# Patient Record
Sex: Female | Born: 1965 | Race: White | Hispanic: No
Health system: Southern US, Community
[De-identification: ages and names within clinical notes are randomized; demographics above are authoritative.]

---

## 2004-12-17 ENCOUNTER — Emergency Department (HOSPITAL_COMMUNITY): Admission: EM | Admit: 2004-12-17 | Discharge: 2004-12-17 | Payer: Self-pay | Admitting: Emergency Medicine

## 2008-10-10 ENCOUNTER — Encounter: Admission: RE | Admit: 2008-10-10 | Discharge: 2008-10-10 | Payer: Self-pay | Admitting: Family Medicine

## 2008-10-26 ENCOUNTER — Other Ambulatory Visit: Admission: RE | Admit: 2008-10-26 | Discharge: 2008-10-26 | Payer: Self-pay | Admitting: Family Medicine

## 2012-10-16 ENCOUNTER — Other Ambulatory Visit (HOSPITAL_COMMUNITY)
Admission: RE | Admit: 2012-10-16 | Discharge: 2012-10-16 | Disposition: A | Discharge status: Home / Self Care | Payer: BC Managed Care – PPO | Source: Ambulatory Visit | Attending: Family Medicine | Admitting: Family Medicine

## 2012-10-16 ENCOUNTER — Other Ambulatory Visit: Payer: Self-pay | Admitting: Family Medicine

## 2012-10-16 DIAGNOSIS — Z1151 Encounter for screening for human papillomavirus (HPV): Secondary | ICD-10-CM | POA: Insufficient documentation

## 2012-10-16 DIAGNOSIS — Z124 Encounter for screening for malignant neoplasm of cervix: Secondary | ICD-10-CM | POA: Insufficient documentation

## 2013-01-26 ENCOUNTER — Other Ambulatory Visit: Payer: Self-pay | Admitting: Family Medicine

## 2013-01-26 DIAGNOSIS — R350 Frequency of micturition: Secondary | ICD-10-CM

## 2013-02-10 ENCOUNTER — Ambulatory Visit
Admission: RE | Admit: 2013-02-10 | Discharge: 2013-02-10 | Disposition: A | Discharge status: Home / Self Care | Payer: BC Managed Care – PPO | Source: Ambulatory Visit | Attending: Family Medicine | Admitting: Family Medicine

## 2013-02-10 DIAGNOSIS — R350 Frequency of micturition: Secondary | ICD-10-CM

## 2014-03-24 ENCOUNTER — Other Ambulatory Visit: Payer: Self-pay

## 2014-03-24 DIAGNOSIS — Z1231 Encounter for screening mammogram for malignant neoplasm of breast: Secondary | ICD-10-CM

## 2014-03-24 IMAGING — US US PELVIS COMPLETE
1 series · 14 of 25 positions shown · non-contrast
Comparison: None.

CLINICAL DATA: Irregular menses.

TRANSABDOMINAL AND TRANSVAGINAL ULTRASOUND OF PELVIS
TECHNIQUE: Both transabdominal and transvaginal ultrasound
examinations of the pelvis were performed. Transabdominal technique
was performed for global imaging of the pelvis including uterus,
ovaries, adnexal regions, and pelvic cul-de-sac.
It was necessary to proceed with endovaginal exam following the
transabdominal exam to visualize the uterus, ovaries and adnexal
regions.

[Series 1: us pelvis complete · 0.28mm/px · 14 of 60 slices shown]
[im 1/60]
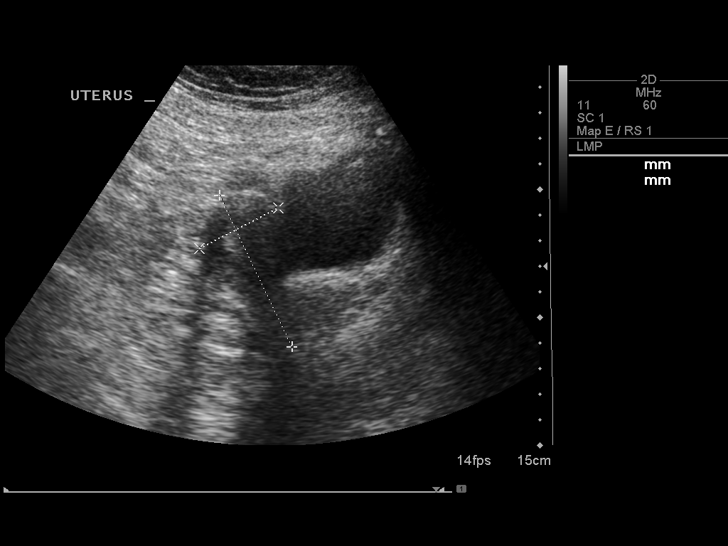
[im 5/60]
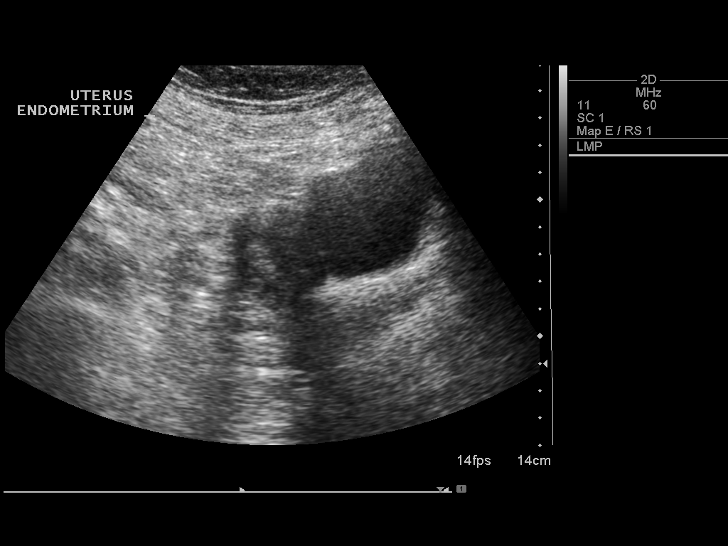
[im 10/60]
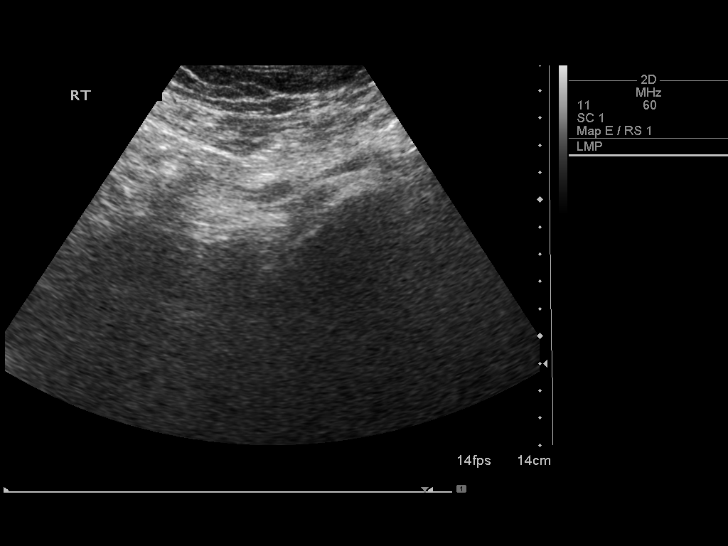
[im 15/60]
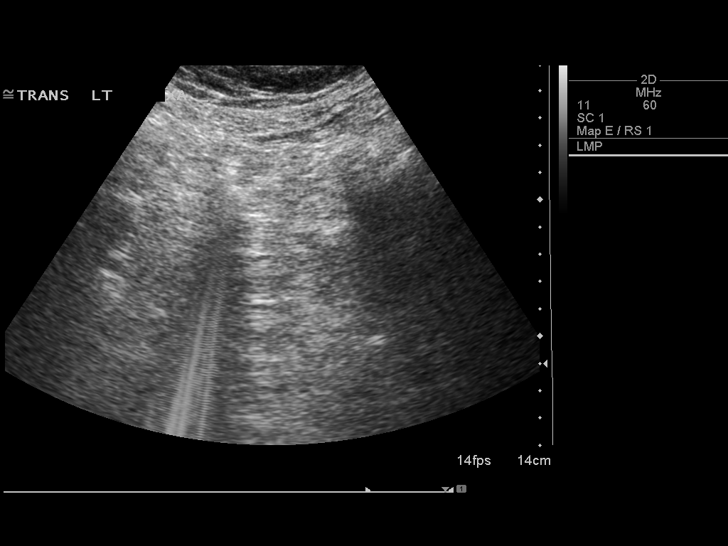
[im 20/60]
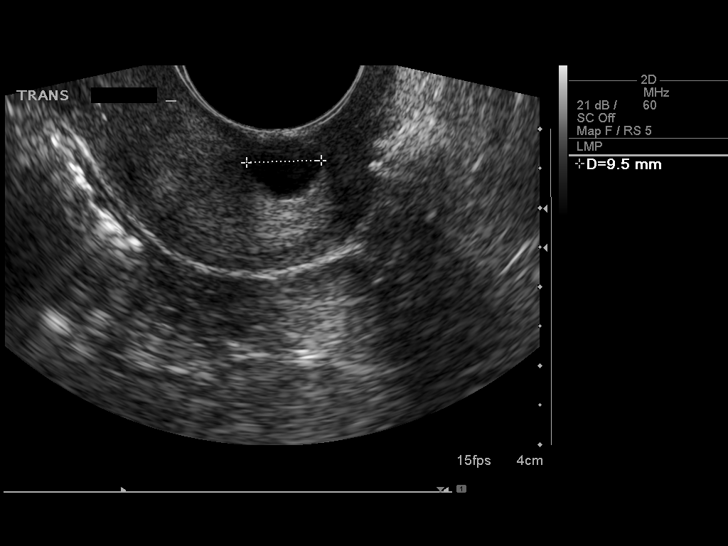
[im 23/60]
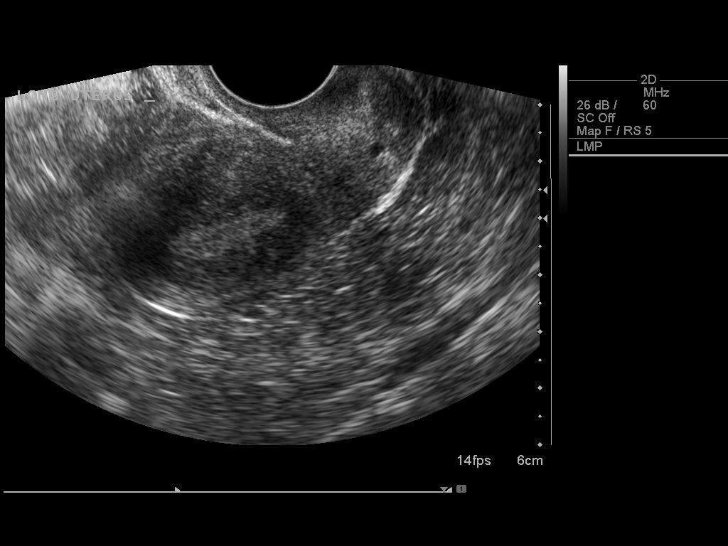
[im 28/60]
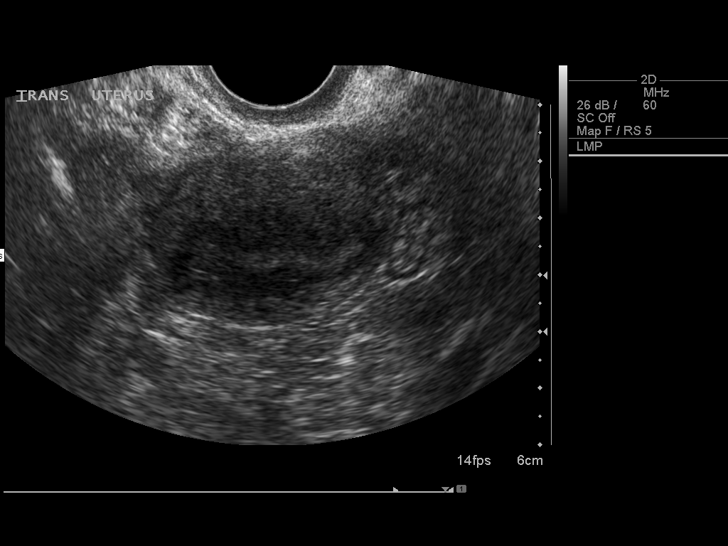
[im 32/60]
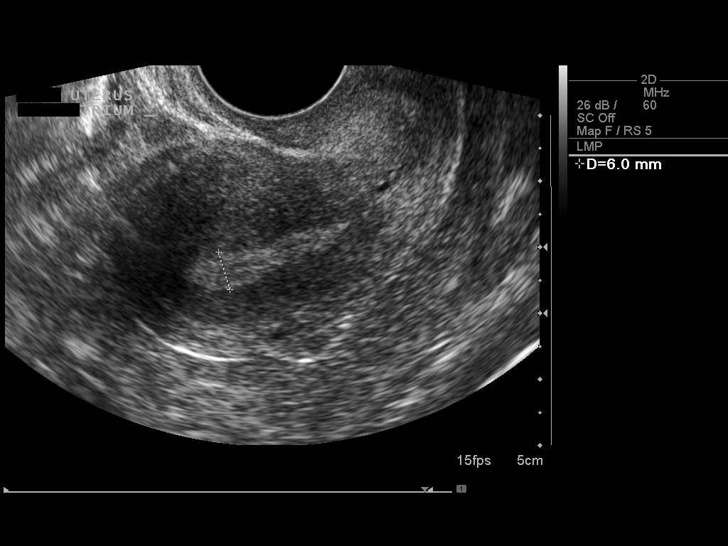
[im 37/60]
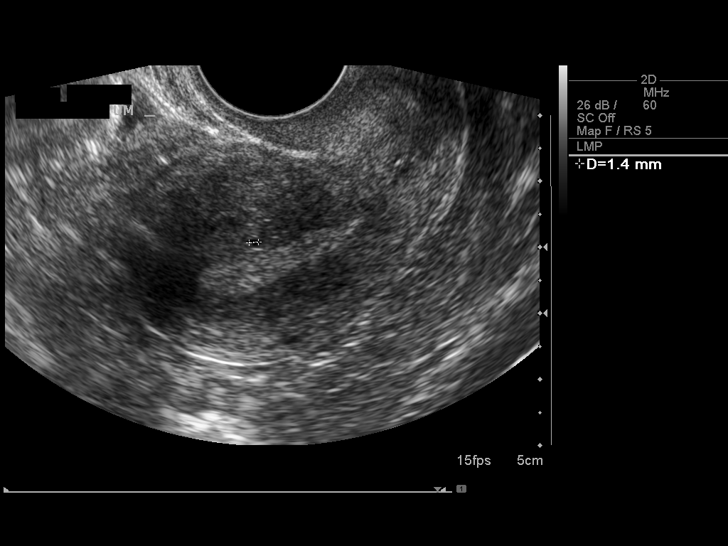
[im 40/60]
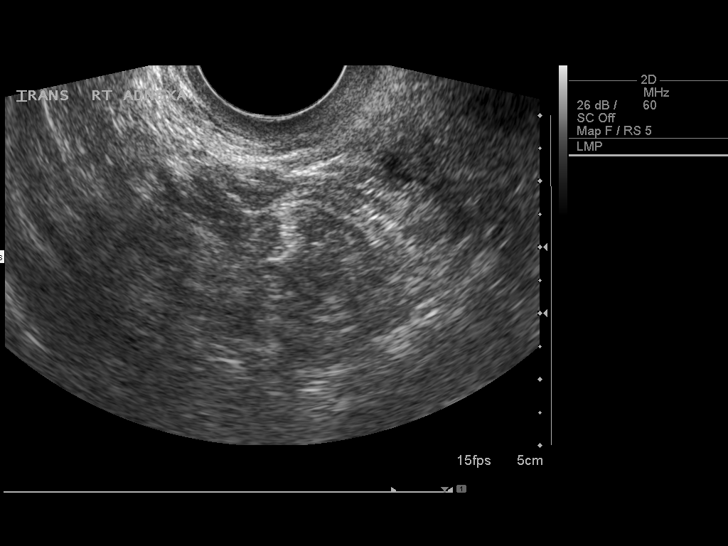
[im 45/60]
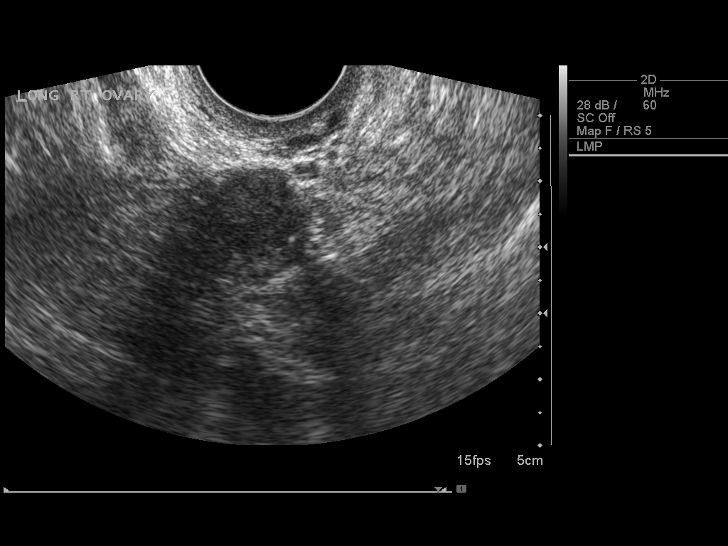
[im 50/60]
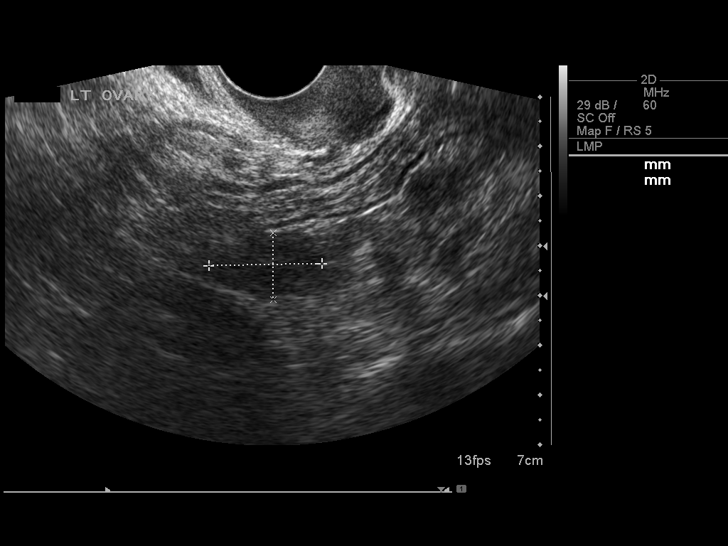
[im 55/60]
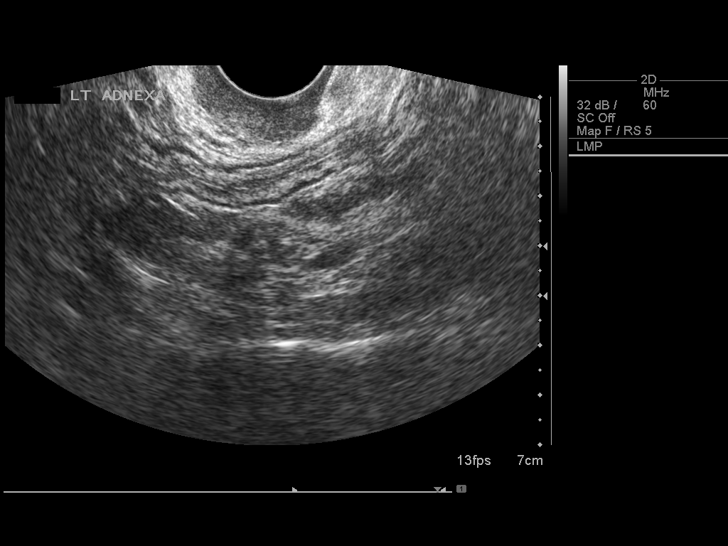
[im 60/60]
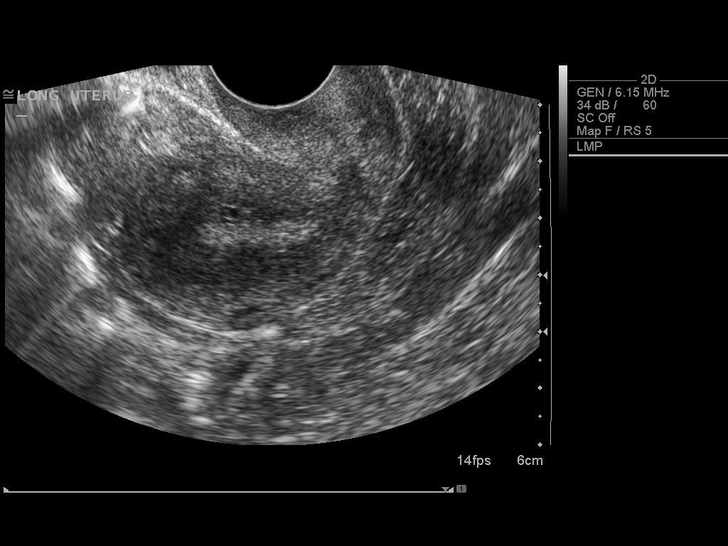

[14 of 25 positions shown; findings below may reference images not displayed]

FINDINGS: Uterus: Measures 6.6 x 3.5 x 4.3 cm, negative.

Endometrium: Measures 6 mm, likely within normal limits for a
reportedly   perimenopausal female.

Right ovary:  Measures 2.0 x 1.8 x 1.4 cm, negative.

Left ovary: Measures 2.3 x 1.3 x 1.9 cm, negative.

Other findings: No free fluid.
IMPRESSION: Normal study. No evidence of pelvic mass or other significant
abnormality.

## 2014-04-18 ENCOUNTER — Ambulatory Visit
Admission: RE | Admit: 2014-04-18 | Discharge: 2014-04-18 | Disposition: A | Discharge status: Home / Self Care | Payer: BC Managed Care – PPO | Source: Ambulatory Visit

## 2014-04-18 ENCOUNTER — Encounter (INDEPENDENT_AMBULATORY_CARE_PROVIDER_SITE_OTHER): Payer: Self-pay

## 2014-04-18 DIAGNOSIS — Z1231 Encounter for screening mammogram for malignant neoplasm of breast: Secondary | ICD-10-CM

## 2014-04-20 ENCOUNTER — Other Ambulatory Visit: Payer: Self-pay | Admitting: Family Medicine

## 2014-04-20 DIAGNOSIS — R928 Other abnormal and inconclusive findings on diagnostic imaging of breast: Secondary | ICD-10-CM

## 2014-04-29 ENCOUNTER — Ambulatory Visit
Admission: RE | Admit: 2014-04-29 | Discharge: 2014-04-29 | Disposition: A | Discharge status: Home / Self Care | Payer: BC Managed Care – PPO | Source: Ambulatory Visit | Attending: Family Medicine | Admitting: Family Medicine

## 2014-04-29 DIAGNOSIS — R928 Other abnormal and inconclusive findings on diagnostic imaging of breast: Secondary | ICD-10-CM

## 2015-05-12 ENCOUNTER — Other Ambulatory Visit: Payer: Self-pay

## 2015-05-12 DIAGNOSIS — Z1231 Encounter for screening mammogram for malignant neoplasm of breast: Secondary | ICD-10-CM

## 2015-06-16 ENCOUNTER — Ambulatory Visit
Admission: RE | Admit: 2015-06-16 | Discharge: 2015-06-16 | Disposition: A | Discharge status: Home / Self Care | Payer: BC Managed Care – PPO | Source: Ambulatory Visit

## 2015-06-16 DIAGNOSIS — Z1231 Encounter for screening mammogram for malignant neoplasm of breast: Secondary | ICD-10-CM

## 2018-05-12 ENCOUNTER — Other Ambulatory Visit: Payer: Self-pay | Admitting: Family Medicine

## 2018-05-12 DIAGNOSIS — R634 Abnormal weight loss: Secondary | ICD-10-CM

## 2018-05-22 ENCOUNTER — Ambulatory Visit
Admission: RE | Admit: 2018-05-22 | Discharge: 2018-05-22 | Disposition: A | Discharge status: Home / Self Care | Payer: BC Managed Care – PPO | Source: Ambulatory Visit | Attending: Family Medicine | Admitting: Family Medicine

## 2018-05-22 DIAGNOSIS — R634 Abnormal weight loss: Secondary | ICD-10-CM

## 2018-05-22 MED ORDER — IOPAMIDOL (ISOVUE-300) INJECTION 61%
100.0000 mL | Freq: Once | INTRAVENOUS | Status: AC | PRN
  Administered 2018-05-22: 100 mL via INTRAVENOUS

## 2020-01-17 ENCOUNTER — Ambulatory Visit: Payer: BC Managed Care – PPO | Attending: Internal Medicine

## 2020-01-17 DIAGNOSIS — Z23 Encounter for immunization: Secondary | ICD-10-CM

## 2020-01-17 NOTE — Progress Notes (Signed)
   Covid-19 Vaccination Clinic  Name:  BASIL BLAKESLEY    MRN: [id number] DOB: 1966-05-21  01/17/2020  Ms. Nedd was observed post Covid-19 immunization for 15 minutes without incident. She was provided with Vaccine Information Sheet and instruction to access the V-Safe system.   Ms. Acheampong was instructed to call 911 with any severe reactions post vaccine: Marland Kitchen Difficulty breathing  . Swelling of face and throat  . A fast heartbeat  . A bad rash all over body  . Dizziness and weakness   Immunizations Administered    Name Date Dose VIS Date Route   Pfizer COVID-19 Vaccine 01/17/2020  9:56 AM 0.3 mL 10/01/2019 Intramuscular   Manufacturer: [employer], [employer]   Lot: [id number]   NDC: [address]-6

## 2020-02-08 ENCOUNTER — Ambulatory Visit: Payer: BC Managed Care – PPO | Attending: Internal Medicine

## 2020-02-08 DIAGNOSIS — Z23 Encounter for immunization: Secondary | ICD-10-CM

## 2020-02-08 NOTE — Progress Notes (Signed)
   Covid-19 Vaccination Clinic  Name:  Heather Fritz    MRN: [id number] DOB: 26-Dec-1965  02/08/2020  [name] was observed post Covid-19 immunization for 15 minutes without incident. She was provided with Vaccine Information Sheet and instruction to access the V-Safe system.   [name] was instructed to call 911 with any severe reactions post vaccine: Marland Kitchen Difficulty breathing  . Swelling of face and throat  . A fast heartbeat  . A bad rash all over body  . Dizziness and weakness   Immunizations Administered    Name Date Dose VIS Date Route   Pfizer COVID-19 Vaccine 02/08/2020 12:06 PM 0.3 mL 12/15/2018 Intramuscular   Manufacturer: [employer], [employer]   Lot: [id number]   NDC: [address]-6

## 2020-09-11 ENCOUNTER — Other Ambulatory Visit: Payer: Self-pay | Admitting: Family Medicine

## 2020-09-11 DIAGNOSIS — Z1231 Encounter for screening mammogram for malignant neoplasm of breast: Secondary | ICD-10-CM

## 2020-09-13 ENCOUNTER — Other Ambulatory Visit: Payer: Self-pay | Admitting: Family Medicine

## 2020-09-13 DIAGNOSIS — N644 Mastodynia: Secondary | ICD-10-CM

## 2020-10-06 ENCOUNTER — Other Ambulatory Visit: Payer: Self-pay | Admitting: Family Medicine

## 2020-11-01 ENCOUNTER — Ambulatory Visit
Admission: RE | Admit: 2020-11-01 | Discharge: 2020-11-01 | Disposition: A | Discharge status: Home / Self Care | Payer: BC Managed Care – PPO | Source: Ambulatory Visit | Attending: Family Medicine | Admitting: Family Medicine

## 2020-11-01 ENCOUNTER — Other Ambulatory Visit: Payer: Self-pay

## 2020-11-01 ENCOUNTER — Ambulatory Visit: Payer: BC Managed Care – PPO

## 2020-11-01 DIAGNOSIS — N644 Mastodynia: Secondary | ICD-10-CM

## 2021-12-13 IMAGING — MG DIGITAL DIAGNOSTIC BILAT W/ TOMO W/ CAD
8 series · 9 of 24 positions shown · non-contrast
Comparison: Previous exam(s).

ACR Breast Density Category a: The breast tissue is almost entirely
fatty.

CLINICAL DATA: Diffuse LEFT breast pain.

EXAM:
DIGITAL DIAGNOSTIC BILATERAL MAMMOGRAM WITH TOMO AND CAD

[L MLO synth-2D]
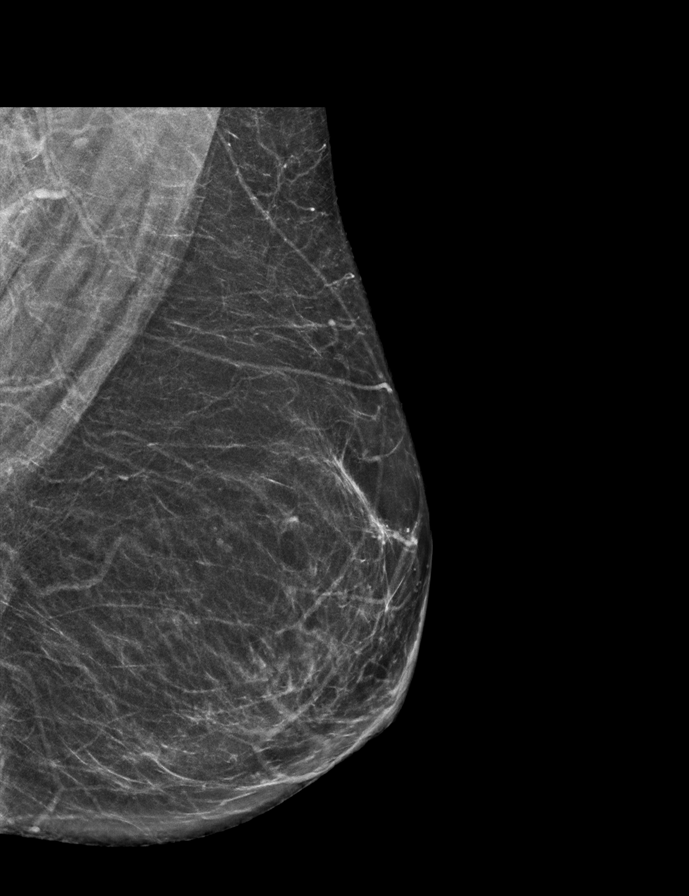

[L CC synth-2D]
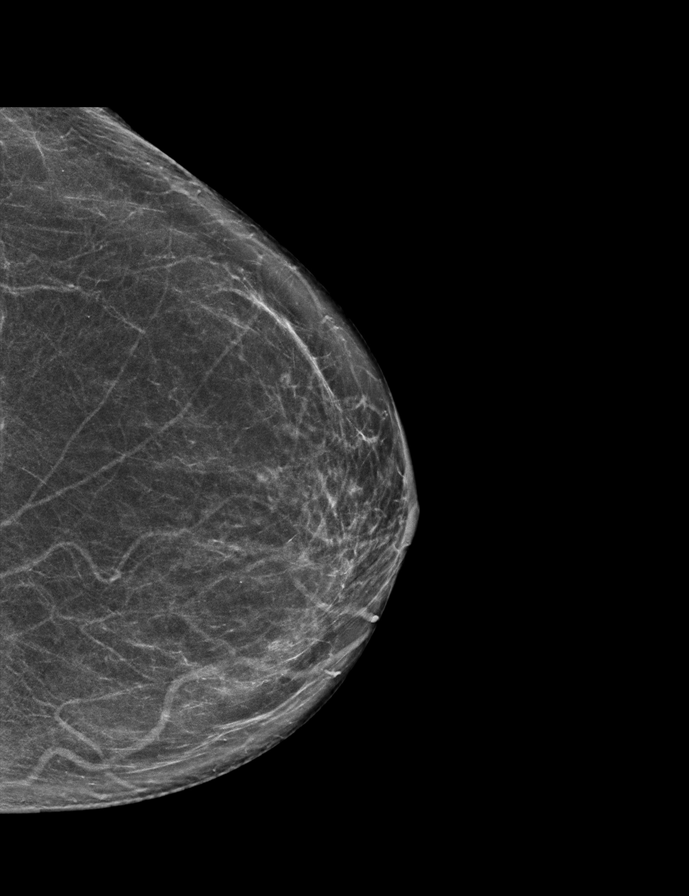

[R MLO synth-2D]
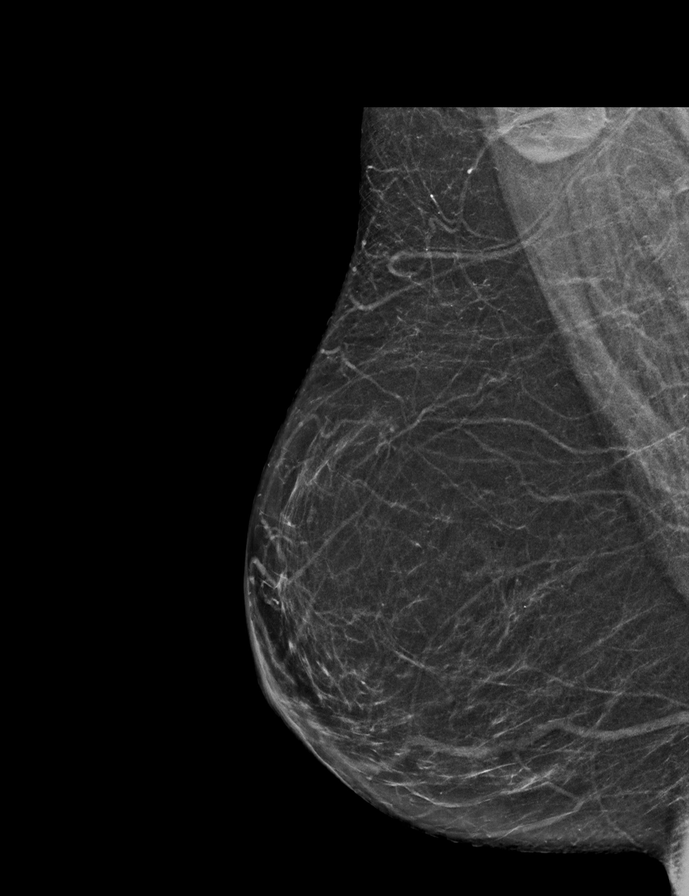

[R CC synth-2D]
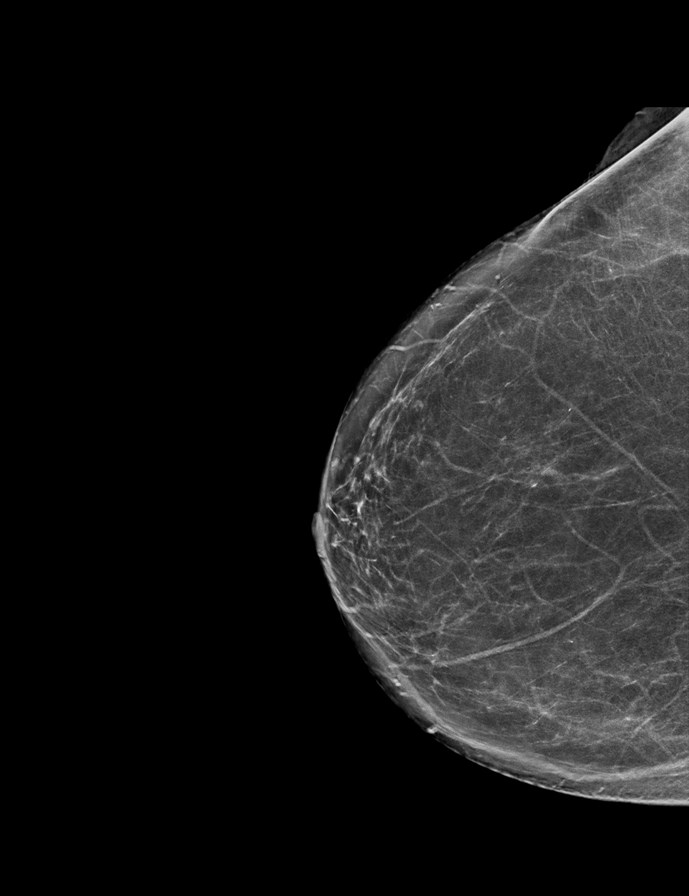

[L CC tomo · 2 of 64 frames shown]
[frame 21/64]
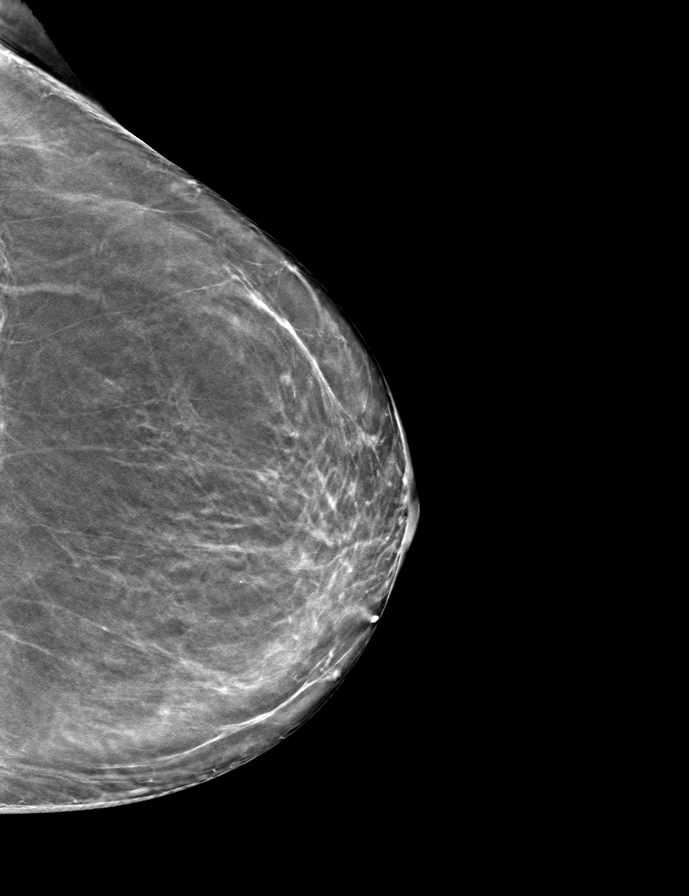
[frame 33/64]
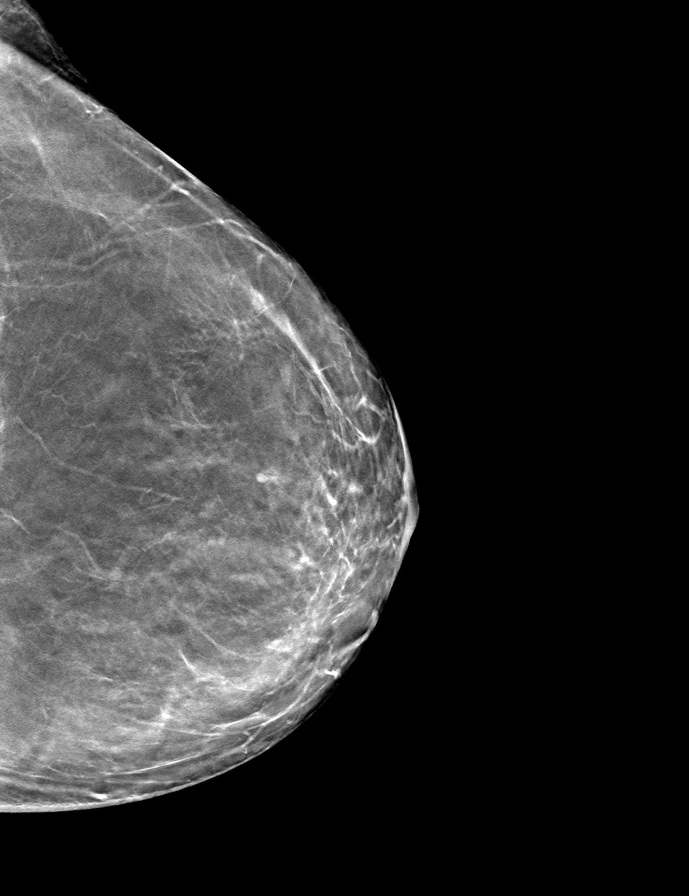

[L MLO tomo · tomo slice 33/64.0]
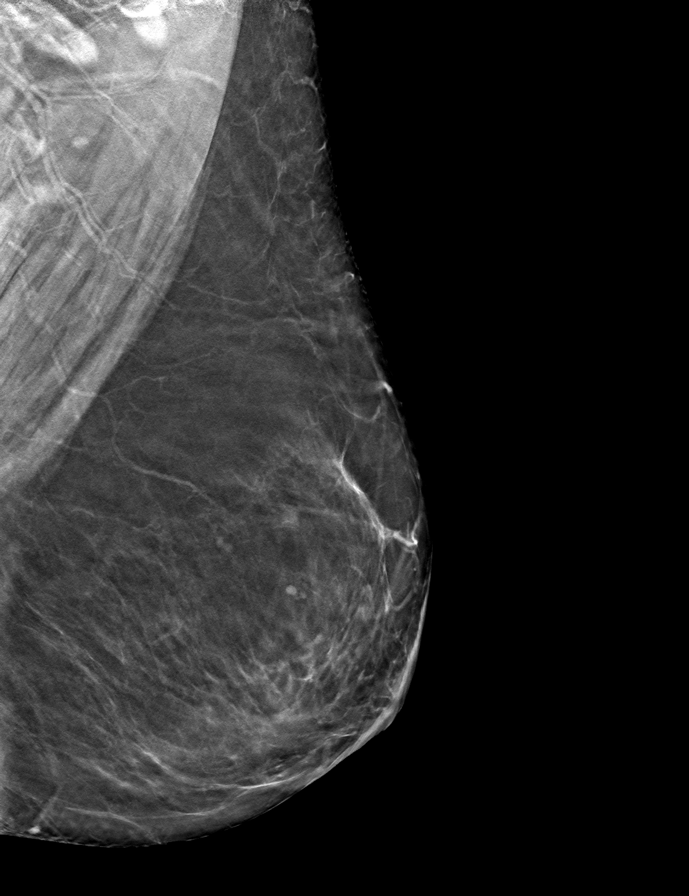

[R CC tomo · tomo slice 35/68.0]
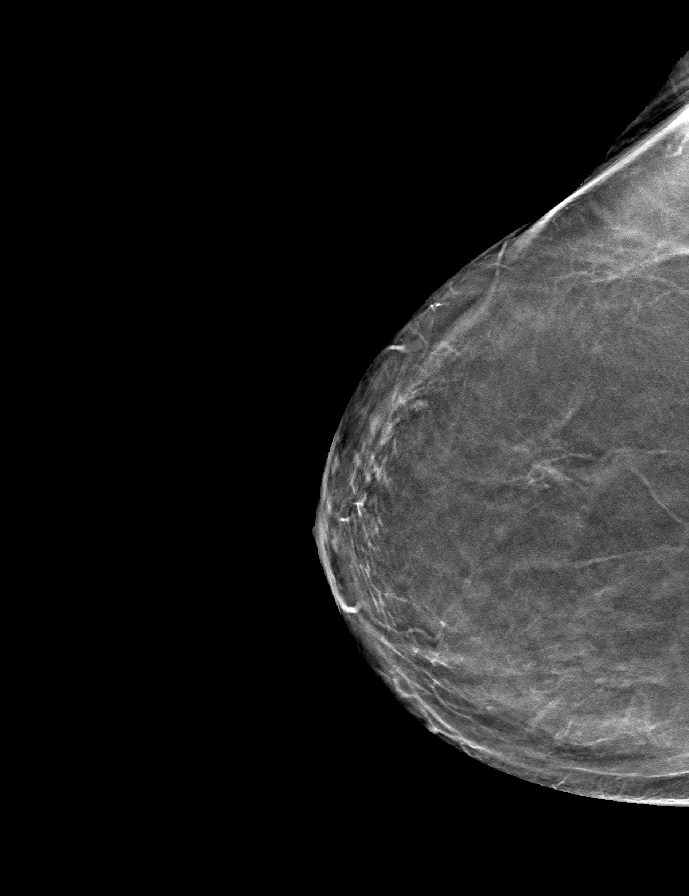

[R MLO tomo · tomo slice 32/63.0]
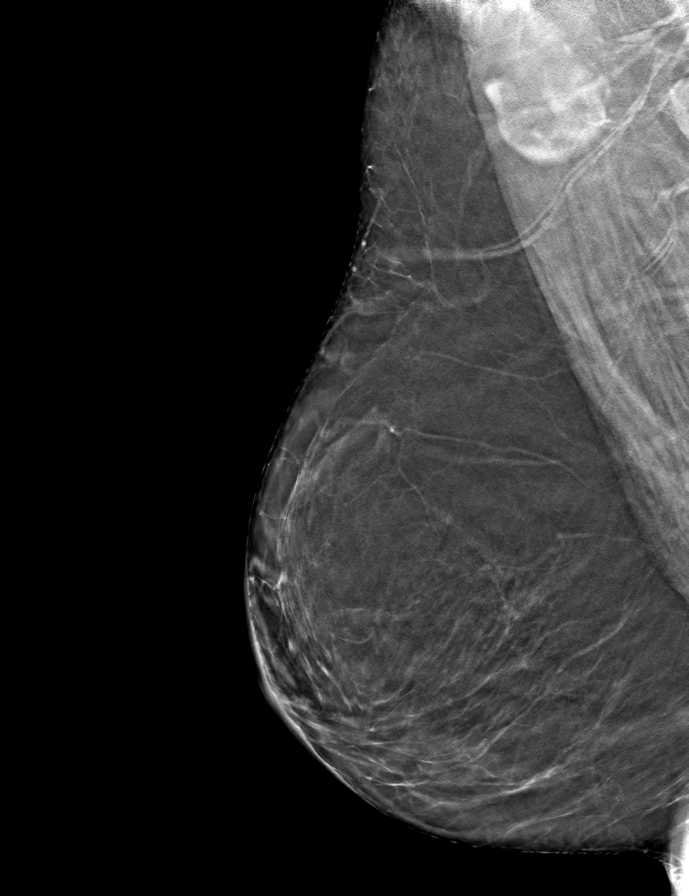

[9 of 24 positions shown; findings below may reference images not displayed]

FINDINGS: No suspicious mass, distortion, or microcalcifications are
identified to suggest presence of malignancy.

Mammographic images were processed with CAD.
IMPRESSION: No mammographic evidence for malignancy.

Breast pain is a common condition. It can be exacerbated by
caffeine, hormonal changes, hormonal medications, and weight
changes. It can be improved by wearing adequate support,
over-the-counter pain medication, low-fat diet, exercise, and ice as
needed. Studies have shown an improvement in cyclic pain with use of
evening primrose oil and vitamin E. However, vitamin E supplements
are not recommended for patients with hypertension. Often breast
pain will resolve on its own without intervention.

RECOMMENDATION:
Screening mammogram in one year.(Code:[id number])

I have discussed the findings and recommendations with the patient.
If applicable, a reminder letter will be sent to the patient
regarding the next appointment.

BI-RADS CATEGORY  1: Negative.

## 2022-06-28 ENCOUNTER — Ambulatory Visit: Payer: BC Managed Care – PPO | Attending: Family Medicine | Admitting: Physical Therapy

## 2022-06-28 ENCOUNTER — Encounter: Payer: Self-pay | Admitting: Physical Therapy

## 2022-06-28 DIAGNOSIS — M6281 Muscle weakness (generalized): Secondary | ICD-10-CM | POA: Diagnosis present

## 2022-06-28 DIAGNOSIS — G8929 Other chronic pain: Secondary | ICD-10-CM | POA: Diagnosis present

## 2022-06-28 DIAGNOSIS — R262 Difficulty in walking, not elsewhere classified: Secondary | ICD-10-CM | POA: Insufficient documentation

## 2022-06-28 DIAGNOSIS — R278 Other lack of coordination: Secondary | ICD-10-CM | POA: Insufficient documentation

## 2022-06-28 DIAGNOSIS — M545 Low back pain, unspecified: Secondary | ICD-10-CM | POA: Diagnosis present

## 2022-06-28 DIAGNOSIS — R293 Abnormal posture: Secondary | ICD-10-CM | POA: Insufficient documentation

## 2022-06-28 DIAGNOSIS — R2681 Unsteadiness on feet: Secondary | ICD-10-CM | POA: Diagnosis present

## 2022-06-28 NOTE — Therapy (Signed)
OUTPATIENT PHYSICAL THERAPY THORACOLUMBAR EVALUATION   Patient Name: Heather Fritz MRN: [id number] DOB:March 29, 1966, 56 y.o., female Today's Date: 06/28/2022   PT End of Session - 06/28/22 1150     Visit Number 1    Date for PT Re-Evaluation 08/23/22    PT Start Time 0928    PT Stop Time 1010    PT Time Calculation (min) 42 min    Activity Tolerance Patient tolerated treatment well    Behavior During Therapy [facility] for tasks assessed/performed             History reviewed. No pertinent past medical history. History reviewed. No pertinent surgical history. There are no problems to display for this patient.   PCP: Sigmund Hazel, MD   REFERRING PROVIDER: Sigmund Hazel, MD   REFERRING DIAG: Diagnosis M54.50 (ICD-10-CM) - Low back pain, unspecified   Rationale for Evaluation and Treatment Rehabilitation  THERAPY DIAG:  Abnormal posture  Difficulty in walking, not elsewhere classified  Muscle weakness (generalized)  Other lack of coordination  Unsteadiness on feet  Chronic bilateral low back pain without sciatica  ONSET DATE: 05/27/2022   SUBJECTIVE:                                                                                                                                                                                           SUBJECTIVE STATEMENT: Patient is a walker. Over the past 2 years she has had increasing back pain. It has gotten to where it limits her mobility. She takes Tylenol twice a day, within recommended dosages. At times it extends into her L lateral hip.  PERTINENT HISTORY:  N/A  PAIN:  Are you having pain? Yes: NPRS scale: 8/10 Pain location: low back/SI joints Pain description: deep, achy Aggravating factors: walking, gardening Relieving factors: Tylenol, Salon Pass less effective   PRECAUTIONS: None  WEIGHT BEARING RESTRICTIONS No  FALLS:  Has patient fallen in last 6 months? No  LIVING ENVIRONMENT: Lives with: lives with an adult  companion Lives in: House/apartment Stairs: No Has following equipment at home: None  OCCUPATION: Retired Runner, broadcasting/film/video  PLOF: Independent  PATIENT GOALS Patient would like to learn some exercises to relieve her back pain.   OBJECTIVE:   DIAGNOSTIC FINDINGS:  N/A  PATIENT SURVEYS:  FOTO 42.7  SCREENING FOR RED FLAGS: Bowel or bladder incontinence: No Spinal tumors: No Cauda equina syndrome: No Compression fracture: No   COGNITION:  Overall cognitive status: Within functional limits for tasks assessed     SENSATION: WFL  MUSCLE LENGTH: Hamstrings: Right 80 deg; Left 78 deg  POSTURE: forward head, decreased lumbar lordosis, and decreased thoracic kyphosis  PALPATION: TTP on lumbar paraspinals, B QL B piriformis, B gluts  LUMBAR ROM:   Active  A/PROM  eval  Flexion floor  Extension WNL  Right lateral flexion To knee, mild pain  Left lateral flexion To knee mild pain  Right rotation WNL  Left rotation WNL   (Blank rows = not tested)  LOWER EXTREMITY ROM:   otherwise WFL  Active  Right eval Left eval  Hip flexion    Hip extension    Hip abduction    Hip adduction    Hip internal rotation    Hip external rotation Mod impaired Mod impaired  Knee flexion    Knee extension    Ankle dorsiflexion    Ankle plantarflexion    Ankle inversion    Ankle eversion      LOWER EXTREMITY MMT:    MMT Right eval Left eval  Hip flexion 4 4  Hip extension 4- 4  Hip abduction 4- 4  Hip adduction    Hip internal rotation    Hip external rotation    Knee flexion 4 4  Knee extension 4 4  Ankle dorsiflexion 4 4  Ankle plantarflexion    Ankle inversion    Ankle eversion     (Blank rows = not tested)  LUMBAR SPECIAL TESTS:  Straight leg raise test: Negative, Slump test: Negative, and SI Compression/distraction test: Negative, SI compression and distraction (-) and Leg length equal.  FUNCTIONAL TESTS:  5 times sit to stand: 11.4 Berg Balance Scale:  53  GAIT: Distance walked: 80 Assistive device utilized: None Level of assistance: Complete Independence Comments: Very quick movement    TODAY'S TREATMENT  HEP, educ   PATIENT EDUCATION:  Education details: HEP, POC Person educated: Patient Education method: Programmer, multimedia, Facilities manager, and Handouts Education comprehension: verbalized understanding and returned demonstration   HOME EXERCISE PROGRAM: [id number]  ASSESSMENT:  CLINICAL IMPRESSION: Patient is a 56 y.o. who was seen today for physical therapy evaluation and treatment for Chronic LBP. Screening for SI and neural pain were neg. She demonstrates mild weakness in her trunk and hips, tightness in ER B, mild balance deficits in SLS. She will benefit from education and HEP for strength and stretching to improve her hip and trunk stability, decrease pain, allow for her to ambulate as she used to.   OBJECTIVE IMPAIRMENTS decreased activity tolerance, decreased balance, decreased coordination, decreased mobility, difficulty walking, decreased ROM, decreased strength, impaired flexibility, postural dysfunction, and pain.   ACTIVITY LIMITATIONS carrying, lifting, standing, and locomotion level  PARTICIPATION LIMITATIONS: cleaning, laundry, shopping, occupation, and yard work  PERSONAL FACTORS Past/current experiences are also affecting patient's functional outcome.   REHAB POTENTIAL: Good  CLINICAL DECISION MAKING: Stable/uncomplicated  EVALUATION COMPLEXITY: Low   GOALS: Goals reviewed with patient? Yes  SHORT TERM GOALS: Target date: 07/19/2022  I with initial HEP Baseline: Goal status: INITIAL  LONG TERM GOALS: Target date: 08/23/2022  I with final HEP Baseline:  Goal status: INITIAL  2.  Increase FOTO score to at least 60. Baseline: 43 Goal status: INITIAL  3.  Patient will be able to walk at least 1/2 mile with back pain < 3/5 Baseline: Walks down her driveway and back with pain 8/10 Goal status:  INITIAL  4.  Increase BERG SLS score to 3/4 with either leg to demonstrate improved strength and stability. Baseline: 2/4, R < L Goal status: INITIAL  PLAN: PT FREQUENCY: 1x/week  PT DURATION: 8 weeks  PLANNED INTERVENTIONS: Therapeutic exercises, Therapeutic activity, Neuromuscular re-education,  Balance training, Gait training, Patient/Family education, Self Care, Joint mobilization, Dry Needling, Electrical stimulation, Moist heat, Ionotophoresis 4mg /ml Dexamethasone, and Manual therapy.  PLAN FOR NEXT SESSION: Update Hep with more strength and stretching.   , DPT 06/28/2022, 11:52 AM

## 2022-07-08 ENCOUNTER — Ambulatory Visit: Payer: BC Managed Care – PPO | Admitting: Physical Therapy

## 2022-07-08 ENCOUNTER — Encounter: Payer: Self-pay | Admitting: Physical Therapy

## 2022-07-08 DIAGNOSIS — M6281 Muscle weakness (generalized): Secondary | ICD-10-CM

## 2022-07-08 DIAGNOSIS — R293 Abnormal posture: Secondary | ICD-10-CM

## 2022-07-08 DIAGNOSIS — R262 Difficulty in walking, not elsewhere classified: Secondary | ICD-10-CM

## 2022-07-08 NOTE — Therapy (Signed)
OUTPATIENT PHYSICAL THERAPY THORACOLUMBAR TREATMENT   Patient Name: Heather Fritz MRN: [id number] DOB:[birth year], 56 y.o., female Today's Date: 07/08/2022   PT End of Session - 07/08/22 0844     Visit Number 2    Date for PT Re-Evaluation 08/23/22    PT Start Time 0845    PT Stop Time 0930    PT Time Calculation (min) 45 min    Activity Tolerance Patient tolerated treatment well    Behavior During Therapy [facility] for tasks assessed/performed             History reviewed. No pertinent past medical history. History reviewed. No pertinent surgical history. There are no problems to display for this patient.   PCP: Kathyrn Lass, MD   REFERRING PROVIDER: Kathyrn Lass, MD   REFERRING DIAG: Diagnosis M54.50 (ICD-10-CM) - Low back pain, unspecified   Rationale for Evaluation and Treatment Rehabilitation  THERAPY DIAG:  Abnormal posture  Difficulty in walking, not elsewhere classified  Muscle weakness (generalized)  ONSET DATE: 05/27/2022   SUBJECTIVE:                                                                                                                                                                                           SUBJECTIVE STATEMENT: "Im great" "Took a Asprin this morning "  PERTINENT HISTORY:  N/A  PAIN:  Are you having pain? Yes: NPRS scale: 0/10 Pain location: low back/SI joints Pain description: deep, achy Aggravating factors: walking, gardening Relieving factors: Tylenol, Salon Pass less effective   PRECAUTIONS: None  WEIGHT BEARING RESTRICTIONS No  FALLS:  Has patient fallen in last 6 months? No  LIVING ENVIRONMENT: Lives with: lives with an adult companion Lives in: House/apartment Stairs: No Has following equipment at home: None  OCCUPATION: Retired Pharmacist, hospital  PLOF: Independent  PATIENT GOALS Patient would like to learn some exercises to relieve her back pain.   OBJECTIVE:   PATIENT SURVEYS:  FOTO 42.7  SCREENING FOR  RED FLAGS: Bowel or bladder incontinence: No Spinal tumors: No Cauda equina syndrome: No Compression fracture: No   COGNITION:  Overall cognitive status: Within functional limits for tasks assessed     SENSATION: WFL  MUSCLE LENGTH: Hamstrings: Right 80 deg; Left 78 deg  POSTURE: forward head, decreased lumbar lordosis, and decreased thoracic kyphosis  PALPATION: TTP on lumbar paraspinals, B QL B piriformis, B gluts  LUMBAR ROM:   Active  A/PROM  eval  Flexion floor  Extension WNL  Right lateral flexion To knee, mild pain  Left lateral flexion To knee mild pain  Right rotation WNL  Left rotation WNL   (Blank rows =  not tested)  LOWER EXTREMITY ROM:   otherwise [facility]  Active  Right eval Left eval  Hip flexion    Hip extension    Hip abduction    Hip adduction    Hip internal rotation    Hip external rotation Mod impaired Mod impaired  Knee flexion    Knee extension    Ankle dorsiflexion    Ankle plantarflexion    Ankle inversion    Ankle eversion      LOWER EXTREMITY MMT:    MMT Right eval Left eval  Hip flexion 4 4  Hip extension 4- 4  Hip abduction 4- 4  Hip adduction    Hip internal rotation    Hip external rotation    Knee flexion 4 4  Knee extension 4 4  Ankle dorsiflexion 4 4  Ankle plantarflexion    Ankle inversion    Ankle eversion     (Blank rows = not tested)  LUMBAR SPECIAL TESTS:  Straight leg raise test: Negative, Slump test: Negative, and SI Compression/distraction test: Negative, SI compression and distraction (-) and Leg length equal.  FUNCTIONAL TESTS:  5 times sit to stand: 11.4 Berg Balance Scale: 53  GAIT: Distance walked: 80 Assistive device utilized: None Level of assistance: Complete Independence Comments: Very quick movement    TODAY'S TREATMENT  07/08/22 NuStep L4 x 6 min S2S holding red ball 2x10  Rows & Ext red 2x10 Hamstring Curls 15lb 2x10 Leg Ext 5lb 2x10 Bridges x10 Bridges LE on pball, bridges,  K2C, Oblq  HS, Single and double K2C, Piriformis stretch    HEP, educ   PATIENT EDUCATION:  Education details: HEP, POC Person educated: Patient Education method: Consulting civil engineer, Demonstration, and Handouts Education comprehension: verbalized understanding and returned demonstration   HOME EXERCISE PROGRAM: [id number]  ASSESSMENT:  CLINICAL IMPRESSION: Pt enters doing well with reports of no pain. She did well with a progression to upp and lower body strengthening interventions. Postural cue required wth rows and ext to prevent forward trunk lean. Cue needed to control the eccentric phase of leg curls and ext. Some L piriformis tightness with stretching.    OBJECTIVE IMPAIRMENTS decreased activity tolerance, decreased balance, decreased coordination, decreased mobility, difficulty walking, decreased ROM, decreased strength, impaired flexibility, postural dysfunction, and pain.   ACTIVITY LIMITATIONS carrying, lifting, standing, and locomotion level  PARTICIPATION LIMITATIONS: cleaning, laundry, shopping, occupation, and yard work  PERSONAL FACTORS Past/current experiences are also affecting patient's functional outcome.   REHAB POTENTIAL: Good  CLINICAL DECISION MAKING: Stable/uncomplicated  EVALUATION COMPLEXITY: Low   GOALS: Goals reviewed with patient? Yes  SHORT TERM GOALS: Target date: 07/19/2022  I with initial HEP Baseline: Goal status: Met  LONG TERM GOALS: Target date: 08/23/2022  I with final HEP Baseline:  Goal status: INITIAL  2.  Increase FOTO score to at least 60. Baseline: 43 Goal status: INITIAL  3.  Patient will be able to walk at least 1/2 mile with back pain < 3/5 Baseline: Walks down her driveway and back with pain 8/10 Goal status: INITIAL  4.  Increase BERG SLS score to 3/4 with either leg to demonstrate improved strength and stability. Baseline: 2/4, R < L Goal status: INITIAL  PLAN: PT FREQUENCY: 1x/week  PT DURATION: 8  weeks  PLANNED INTERVENTIONS: Therapeutic exercises, Therapeutic activity, Neuromuscular re-education, Balance training, Gait training, Patient/Family education, Self Care, Joint mobilization, Dry Needling, Electrical stimulation, Moist heat, Ionotophoresis 38m/ml Dexamethasone, and Manual therapy.  PLAN FOR NEXT SESSION: Update Hep with more  strength and stretching.   Marcelina Morel, DPT 07/08/2022, 8:45 AM

## 2022-07-15 ENCOUNTER — Ambulatory Visit: Payer: BC Managed Care – PPO | Admitting: Physical Therapy

## 2022-07-15 ENCOUNTER — Encounter: Payer: Self-pay | Admitting: Physical Therapy

## 2022-07-15 DIAGNOSIS — R293 Abnormal posture: Secondary | ICD-10-CM | POA: Diagnosis not present

## 2022-07-15 DIAGNOSIS — M6281 Muscle weakness (generalized): Secondary | ICD-10-CM

## 2022-07-15 DIAGNOSIS — R262 Difficulty in walking, not elsewhere classified: Secondary | ICD-10-CM

## 2022-07-15 NOTE — Therapy (Signed)
OUTPATIENT PHYSICAL THERAPY THORACOLUMBAR TREATMENT   Patient Name: Heather Fritz MRN: [id number] DOB:[birth year], 56 y.o., female Today's Date: 07/15/2022   PT End of Session - 07/15/22 1345     Visit Number 3    Date for PT Re-Evaluation 08/23/22    PT Start Time 6269    PT Stop Time 1430    PT Time Calculation (min) 45 min    Activity Tolerance Patient tolerated treatment well    Behavior During Therapy [facility] for tasks assessed/performed             History reviewed. No pertinent past medical history. History reviewed. No pertinent surgical history. There are no problems to display for this patient.   PCP: Kathyrn Lass, MD   REFERRING PROVIDER: Kathyrn Lass, MD   REFERRING DIAG: Diagnosis M54.50 (ICD-10-CM) - Low back pain, unspecified   Rationale for Evaluation and Treatment Rehabilitation  THERAPY DIAG:  Abnormal posture  Difficulty in walking, not elsewhere classified  Muscle weakness (generalized)  ONSET DATE: 05/27/2022   SUBJECTIVE:                                                                                                                                                                                           SUBJECTIVE STATEMENT: "I feel good today"  PERTINENT HISTORY:  N/A  PAIN:  Are you having pain? Yes: NPRS scale: 0/10 Pain location: low back/SI joints Pain description: deep, achy Aggravating factors: walking, gardening Relieving factors: Tylenol, Salon Pass less effective   PRECAUTIONS: None  WEIGHT BEARING RESTRICTIONS No  FALLS:  Has patient fallen in last 6 months? No  LIVING ENVIRONMENT: Lives with: lives with an adult companion Lives in: House/apartment Stairs: No Has following equipment at home: None  OCCUPATION: Retired Pharmacist, hospital  PLOF: Independent  PATIENT GOALS Patient would like to learn some exercises to relieve her back pain.   OBJECTIVE:   PATIENT SURVEYS:  FOTO 42.7  SCREENING FOR RED FLAGS: Bowel or  bladder incontinence: No Spinal tumors: No Cauda equina syndrome: No Compression fracture: No   COGNITION:  Overall cognitive status: Within functional limits for tasks assessed     SENSATION: WFL  MUSCLE LENGTH: Hamstrings: Right 80 deg; Left 78 deg  POSTURE: forward head, decreased lumbar lordosis, and decreased thoracic kyphosis  PALPATION: TTP on lumbar paraspinals, B QL B piriformis, B gluts  LUMBAR ROM:   Active  A/PROM  eval  Flexion floor  Extension WNL  Right lateral flexion To knee, mild pain  Left lateral flexion To knee mild pain  Right rotation WNL  Left rotation WNL   (Blank rows = not tested)  LOWER EXTREMITY ROM:   otherwise [facility]  Active  Right eval Left eval  Hip flexion    Hip extension    Hip abduction    Hip adduction    Hip internal rotation    Hip external rotation Mod impaired Mod impaired  Knee flexion    Knee extension    Ankle dorsiflexion    Ankle plantarflexion    Ankle inversion    Ankle eversion      LOWER EXTREMITY MMT:    MMT Right eval Left eval  Hip flexion 4 4  Hip extension 4- 4  Hip abduction 4- 4  Hip adduction    Hip internal rotation    Hip external rotation    Knee flexion 4 4  Knee extension 4 4  Ankle dorsiflexion 4 4  Ankle plantarflexion    Ankle inversion    Ankle eversion     (Blank rows = not tested)  LUMBAR SPECIAL TESTS:  Straight leg raise test: Negative, Slump test: Negative, and SI Compression/distraction test: Negative, SI compression and distraction (-) and Leg length equal.  FUNCTIONAL TESTS:  5 times sit to stand: 11.4 Berg Balance Scale: 53  GAIT: Distance walked: 80 Assistive device utilized: None Level of assistance: Complete Independence Comments: Very quick movement    TODAY'S TREATMENT  07/15/22 Recumbent bike L3 x 6 min Rows & Lats 15lb 2x10 Shoulder Ext green 2x10 Hamstring curls 15lb x12 Leg Ext 5lb 2x12 Supine bridge x10 Bridge & March 2x10 total  HS stretch  band 4x15''  Single K2C x3   07/08/22 NuStep L4 x 6 min S2S holding red ball 2x10  Rows & Ext red 2x10 Hamstring Curls 15lb 2x10 Leg Ext 5lb 2x10 Bridges x10 Bridges LE on pball, bridges, K2C, Oblq  HS, Single and double K2C, Piriformis stretch    HEP, educ   PATIENT EDUCATION:  Education details: HEP, POC Person educated: Patient Education method: Consulting civil engineer, Demonstration, and Handouts Education comprehension: verbalized understanding and returned demonstration   HOME EXERCISE PROGRAM: [id number]  ASSESSMENT:  CLINICAL IMPRESSION: Again enters doing well with reports of no pain. Continued with a progression to upp and lower body strengthening interventions. No issue with machine level interventions.  Postural cue required with ext to prevent forward trunk lean. Bilateral LE tightens remains.   OBJECTIVE IMPAIRMENTS decreased activity tolerance, decreased balance, decreased coordination, decreased mobility, difficulty walking, decreased ROM, decreased strength, impaired flexibility, postural dysfunction, and pain.   ACTIVITY LIMITATIONS carrying, lifting, standing, and locomotion level  PARTICIPATION LIMITATIONS: cleaning, laundry, shopping, occupation, and yard work  PERSONAL FACTORS Past/current experiences are also affecting patient's functional outcome.   REHAB POTENTIAL: Good  CLINICAL DECISION MAKING: Stable/uncomplicated  EVALUATION COMPLEXITY: Low   GOALS: Goals reviewed with patient? Yes  SHORT TERM GOALS: Target date: 07/19/2022  I with initial HEP Baseline: Goal status: Met  LONG TERM GOALS: Target date: 08/23/2022  I with final HEP Baseline:  Goal status: INITIAL  2.  Increase FOTO score to at least 60. Baseline: 43 Goal status: INITIAL  3.  Patient will be able to walk at least 1/2 mile with back pain < 3/5 Baseline: Walks down her driveway and back with pain 8/10 Goal status: INITIAL  4.  Increase BERG SLS score to 3/4 with either leg  to demonstrate improved strength and stability. Baseline: 2/4, R < L Goal status: INITIAL  PLAN: PT FREQUENCY: 1x/week  PT DURATION: 8 weeks  PLANNED INTERVENTIONS: Therapeutic exercises, Therapeutic activity, Neuromuscular re-education, Balance training, Gait  training, Patient/Family education, Self Care, Joint mobilization, Dry Needling, Electrical stimulation, Moist heat, Ionotophoresis 71m/ml Dexamethasone, and Manual therapy.  PLAN FOR NEXT SESSION: Update Hep with more strength and stretching.   SMarcelina Morel DPT 07/15/2022, 1:46 PM

## 2022-07-22 ENCOUNTER — Ambulatory Visit: Payer: BC Managed Care – PPO | Admitting: Physical Therapy

## 2022-07-29 ENCOUNTER — Ambulatory Visit: Payer: BC Managed Care – PPO | Admitting: Physical Therapy

## 2022-12-09 ENCOUNTER — Other Ambulatory Visit: Payer: Self-pay | Admitting: Family Medicine

## 2022-12-09 DIAGNOSIS — Z1231 Encounter for screening mammogram for malignant neoplasm of breast: Secondary | ICD-10-CM

## 2023-01-10 ENCOUNTER — Ambulatory Visit
Admission: RE | Admit: 2023-01-10 | Discharge: 2023-01-10 | Disposition: A | Discharge status: Home / Self Care | Payer: BC Managed Care – PPO | Source: Ambulatory Visit | Attending: Family Medicine | Admitting: Family Medicine

## 2023-01-10 DIAGNOSIS — Z1231 Encounter for screening mammogram for malignant neoplasm of breast: Secondary | ICD-10-CM
